# Patient Record
Sex: Female | Born: 1995 | Race: Black or African American | Hispanic: No | Marital: Single | Smoking: Never smoker
Health system: Southern US, Community
[De-identification: ages and names within clinical notes are randomized; demographics above are authoritative.]

## PROBLEM LIST (undated history)

## (undated) DIAGNOSIS — N750 Cyst of Bartholin's gland: Secondary | ICD-10-CM

---

## 2017-02-04 ENCOUNTER — Encounter (HOSPITAL_COMMUNITY): Payer: Self-pay

## 2017-02-04 ENCOUNTER — Emergency Department (HOSPITAL_COMMUNITY)
Admission: EM | Admit: 2017-02-04 | Discharge: 2017-02-04 | Disposition: A | Discharge status: Home / Self Care | Payer: Self-pay | Attending: Emergency Medicine | Admitting: Emergency Medicine

## 2017-02-04 DIAGNOSIS — Z5321 Procedure and treatment not carried out due to patient leaving prior to being seen by health care provider: Secondary | ICD-10-CM | POA: Insufficient documentation

## 2017-02-04 DIAGNOSIS — R109 Unspecified abdominal pain: Secondary | ICD-10-CM | POA: Insufficient documentation

## 2017-02-04 HISTORY — DX: Cyst of Bartholin's gland: N75.0

## 2017-02-04 LAB — URINALYSIS, ROUTINE W REFLEX MICROSCOPIC
BILIRUBIN URINE: NEGATIVE
Bacteria, UA: NONE SEEN
GLUCOSE, UA: NEGATIVE mg/dL
HGB URINE DIPSTICK: NEGATIVE
Ketones, ur: NEGATIVE mg/dL
NITRITE: NEGATIVE
PH: 7 (ref 5.0–8.0)
Protein, ur: NEGATIVE mg/dL
SPECIFIC GRAVITY, URINE: 1.024 (ref 1.005–1.030)

## 2017-02-04 LAB — COMPREHENSIVE METABOLIC PANEL
ALBUMIN: 4.1 g/dL (ref 3.5–5.0)
ALK PHOS: 47 U/L (ref 38–126)
ALT: 20 U/L (ref 14–54)
ANION GAP: 7 (ref 5–15)
AST: 23 U/L (ref 15–41)
BUN: 10 mg/dL (ref 6–20)
CALCIUM: 9.1 mg/dL (ref 8.9–10.3)
CO2: 24 mmol/L (ref 22–32)
Chloride: 107 mmol/L (ref 101–111)
Creatinine, Ser: 0.67 mg/dL (ref 0.44–1.00)
GFR calc Af Amer: >60 mL/min (ref >60)
GLUCOSE: 89 mg/dL (ref 65–99)
POTASSIUM: 3.4 mmol/L — AB (ref 3.5–5.1)
Sodium: 138 mmol/L (ref 135–145)
TOTAL PROTEIN: 7.4 g/dL (ref 6.5–8.1)
Total Bilirubin: 0.5 mg/dL (ref 0.3–1.2)

## 2017-02-04 LAB — CBC
HEMATOCRIT: 34.8 % — AB (ref 36.0–46.0)
Hemoglobin: 11.9 g/dL — ABNORMAL LOW (ref 12.0–15.0)
MCH: 29.6 pg (ref 26.0–34.0)
MCHC: 34.2 g/dL (ref 30.0–36.0)
MCV: 86.6 fL (ref 78.0–100.0)
Platelets: 226 10*3/uL (ref 150–400)
RBC: 4.02 MIL/uL (ref 3.87–5.11)
RDW: 14.6 % (ref 11.5–15.5)
WBC: 5.2 10*3/uL (ref 4.0–10.5)

## 2017-02-04 LAB — I-STAT BETA HCG BLOOD, ED (MC, WL, AP ONLY)

## 2017-02-04 LAB — LIPASE, BLOOD: Lipase: 26 U/L (ref 11–51)

## 2017-02-04 NOTE — ED Triage Notes (Signed)
Per EMS- Patient c/o abdominal pain since this AM..

## 2017-02-04 NOTE — ED Notes (Signed)
Called Pt in lobby no response x2. 

## 2017-02-04 NOTE — ED Notes (Signed)
Called Pt in lobby for vital recheck, no response. 

## 2017-02-04 NOTE — ED Notes (Signed)
Called Pt in lobby no response x3

## 2017-02-04 NOTE — ED Triage Notes (Signed)
Patient c/o lower abdominal pain since 0900. Patient denies any vaginal discharge or dysuria.

## 2017-03-16 ENCOUNTER — Encounter: Payer: Self-pay | Admitting: Emergency Medicine

## 2017-03-16 ENCOUNTER — Emergency Department (HOSPITAL_COMMUNITY)
Admission: EM | Admit: 2017-03-16 | Discharge: 2017-03-16 | Disposition: A | Discharge status: Home / Self Care | Payer: Self-pay | Attending: Emergency Medicine | Admitting: Emergency Medicine

## 2017-03-16 ENCOUNTER — Other Ambulatory Visit: Payer: Self-pay

## 2017-03-16 DIAGNOSIS — R1012 Left upper quadrant pain: Secondary | ICD-10-CM | POA: Insufficient documentation

## 2017-03-16 DIAGNOSIS — R1032 Left lower quadrant pain: Secondary | ICD-10-CM | POA: Insufficient documentation

## 2017-03-16 DIAGNOSIS — R109 Unspecified abdominal pain: Secondary | ICD-10-CM

## 2017-03-16 LAB — COMPREHENSIVE METABOLIC PANEL
ALT: 18 U/L (ref 14–54)
ANION GAP: 9 (ref 5–15)
AST: 21 U/L (ref 15–41)
Albumin: 4.1 g/dL (ref 3.5–5.0)
Alkaline Phosphatase: 52 U/L (ref 38–126)
BUN: 12 mg/dL (ref 6–20)
CALCIUM: 9.4 mg/dL (ref 8.9–10.3)
CHLORIDE: 107 mmol/L (ref 101–111)
CO2: 21 mmol/L — AB (ref 22–32)
Creatinine, Ser: 0.6 mg/dL (ref 0.44–1.00)
GFR calc non Af Amer: >60 mL/min (ref >60)
Glucose, Bld: 90 mg/dL (ref 65–99)
POTASSIUM: 3.7 mmol/L (ref 3.5–5.1)
SODIUM: 137 mmol/L (ref 135–145)
Total Bilirubin: 0.4 mg/dL (ref 0.3–1.2)
Total Protein: 7.8 g/dL (ref 6.5–8.1)

## 2017-03-16 LAB — POC URINE PREG, ED: PREG TEST UR: NEGATIVE

## 2017-03-16 LAB — CBC WITH DIFFERENTIAL/PLATELET
Basophils Absolute: 0 10*3/uL (ref 0.0–0.1)
Basophils Relative: 0 %
EOS ABS: 0 10*3/uL (ref 0.0–0.7)
Eosinophils Relative: 0 %
HCT: 35.4 % — ABNORMAL LOW (ref 36.0–46.0)
HEMOGLOBIN: 12.1 g/dL (ref 12.0–15.0)
LYMPHS ABS: 1.6 10*3/uL (ref 0.7–4.0)
LYMPHS PCT: 20 %
MCH: 29.6 pg (ref 26.0–34.0)
MCHC: 34.2 g/dL (ref 30.0–36.0)
MCV: 86.6 fL (ref 78.0–100.0)
Monocytes Absolute: 0.5 10*3/uL (ref 0.1–1.0)
Monocytes Relative: 7 %
NEUTROS PCT: 73 %
Neutro Abs: 6 10*3/uL (ref 1.7–7.7)
Platelets: 213 10*3/uL (ref 150–400)
RBC: 4.09 MIL/uL (ref 3.87–5.11)
RDW: 13.4 % (ref 11.5–15.5)
WBC: 8.1 10*3/uL (ref 4.0–10.5)

## 2017-03-16 LAB — URINALYSIS, ROUTINE W REFLEX MICROSCOPIC
Bilirubin Urine: NEGATIVE
Glucose, UA: NEGATIVE mg/dL
Hgb urine dipstick: NEGATIVE
Ketones, ur: NEGATIVE mg/dL
Nitrite: NEGATIVE
PROTEIN: 100 mg/dL — AB
Specific Gravity, Urine: 1.025 (ref 1.005–1.030)
pH: 5 (ref 5.0–8.0)

## 2017-03-16 LAB — I-STAT BETA HCG BLOOD, ED (MC, WL, AP ONLY)

## 2017-03-16 LAB — LIPASE, BLOOD: Lipase: 34 U/L (ref 11–51)

## 2017-03-16 NOTE — ED Triage Notes (Signed)
Patient states she was walking with a friend and "play fighting" at 1400 yesterday and states she may have strained something. Unknown if she is pregnant.

## 2017-03-16 NOTE — Discharge Instructions (Signed)
You can treat your symptoms with advil or tylenol as needed. Return to the ER if symptoms worsen, if you have blood in your stool, are unable to keep fluids down, or for new or concerning symptoms.

## 2017-03-16 NOTE — ED Triage Notes (Signed)
Patient states the pain has decreased since she has been sitting

## 2017-03-16 NOTE — ED Provider Notes (Signed)
[facility]-EMERGENCY DEPT Provider Note   CSN: [id number] Arrival date & time: 03/16/17  0346     History   Chief Complaint Chief Complaint  Patient presents with  . Abdominal Pain    HPI Candace Kim is a 21 y.o. female without significant PMHx, presenting to ED with gradual onset of intermittent left sided abdominal pain that began yesterday around 1400. Pt states she was "play fighting" with a friend and thinks she may have strained a muscle. She states pain comes and goes, and is not currently present in ED. She states last BM was just prior to arrival and normal. LMP Oct 9. Denies N/V/D/C, F/C, vaginal bleeding or discharge, urinary sx, or hx of abdominal surgeries.  The history is provided by the patient.    Past Medical History:  Diagnosis Date  . Bartholin cyst     There are no active problems to display for this patient.   History reviewed. No pertinent surgical history.  OB History    No data available       Home Medications    Prior to Admission medications   Medication Sig Start Date End Date Taking? Authorizing Provider  OVER THE COUNTER MEDICATION Take 1 tablet daily by mouth.   Yes [provider]    Family History Family History  Problem Relation Age of Onset  . Chronic Renal Failure Mother     Social History Social History   Tobacco Use  . Smoking status: Never Smoker  . Smokeless tobacco: Never Used  Substance Use Topics  . Alcohol use: No  . Drug use: No     Allergies   Patient has no known allergies.   Review of Systems Review of Systems  Constitutional: Negative for chills and fever.  Gastrointestinal: Positive for abdominal pain. Negative for constipation, diarrhea, nausea and vomiting.  Genitourinary: Negative for dysuria, frequency, vaginal bleeding and vaginal discharge.  All other systems reviewed and are negative.    Physical Exam Updated Vital Signs BP 126/69   Pulse 82   Temp  98.2 F (36.8 C) (Oral)   Resp 18   Ht 5' (1.524 m)   Wt 55.8 kg (123 lb)   LMP 02/15/2017 (Exact Date) Comment: patient states she is taking pills to get pregnant-fertile prep  SpO2 100%   BMI 24.02 kg/m   Physical Exam  Constitutional: She appears well-developed and well-nourished.  Non-toxic appearance. She does not appear ill. No distress.  HENT:  Head: Normocephalic and atraumatic.  Mouth/Throat: Oropharynx is clear and moist.  Eyes: Conjunctivae are normal.  Cardiovascular: Normal rate, regular rhythm and normal heart sounds.  Pulmonary/Chest: Effort normal and breath sounds normal.  Abdominal: Soft. Normal appearance and bowel sounds are normal. She exhibits no distension and no mass. There is tenderness in the left upper quadrant. There is no rigidity, no rebound, no guarding, no CVA tenderness and no tenderness at McBurney's point.  Neurological: She is alert.  Skin: Skin is warm.  Psychiatric: She has a normal mood and affect. Her behavior is normal.  Nursing note and vitals reviewed.    ED Treatments / Results  Labs (all labs ordered are listed, but only abnormal results are displayed) Labs Reviewed  URINALYSIS, ROUTINE W REFLEX MICROSCOPIC - Abnormal; Notable for the following components:      Result Value   APPearance HAZY (*)    Protein, ur 100 (*)    Leukocytes, UA SMALL (*)    Bacteria, UA FEW (*)  Squamous Epithelial / LPF 0-5 (*)    All other components within normal limits  COMPREHENSIVE METABOLIC PANEL - Abnormal; Notable for the following components:   CO2 21 (*)    All other components within normal limits  CBC WITH DIFFERENTIAL/PLATELET - Abnormal; Notable for the following components:   HCT 35.4 (*)    All other components within normal limits  LIPASE, BLOOD  I-STAT BETA HCG BLOOD, ED ([facility], [facility], [facility] ONLY)  POC URINE PREG, ED    EKG  EKG Interpretation None       Radiology No results found.  Procedures Procedures (including critical  care time)  Medications Ordered in ED Medications - No data to display   Initial Impression / Assessment and Plan / ED Course  I have reviewed the triage vital signs and the nursing notes.  Pertinent labs & imaging results that were available during my care of the patient were reviewed by me and considered in my medical decision making (see chart for details).    Pt w intermittent left-sided abdominal pain. Patient without pain in the ED. Patient is well-appearing, nontoxic, nonseptic appearing, in no apparent distress. Abdominal exam benign.     On exam patient does not have a surgical abdomen and there are no peritoneal signs.  No indication of appendicitis, bowel obstruction, bowel perforation, cholecystitis, diverticulitis,  or ectopic pregnancy. Do not feel imaging is indicated at this time. Labs wnl. U/A without infection. VSS. Patient does not meet the SIRS or Sepsis criteria. Patient discharged home with symptomatic treatment and given strict instructions for follow-up with their primary care physician.  Pt safe for discharge.  Discussed results, findings, treatment and follow up. Patient advised of return precautions. Patient verbalized understanding and agreed with plan.  Final Clinical Impressions(s) / ED Diagnoses   Final diagnoses:  Left sided abdominal pain    ED Discharge Orders    None       Robinson, Swazil[place]Jordan N, PA-C 03/16/17 [id number]    Derwood KaplanNanavati, Ankit, MD 03/17/17 [phone]

## 2017-04-24 ENCOUNTER — Emergency Department (HOSPITAL_COMMUNITY): Payer: Self-pay

## 2017-04-24 ENCOUNTER — Encounter (HOSPITAL_COMMUNITY): Payer: Self-pay | Admitting: Emergency Medicine

## 2017-04-24 ENCOUNTER — Emergency Department (HOSPITAL_COMMUNITY)
Admission: EM | Admit: 2017-04-24 | Discharge: 2017-04-24 | Disposition: A | Discharge status: Home / Self Care | Payer: Self-pay | Attending: Emergency Medicine | Admitting: Emergency Medicine

## 2017-04-24 DIAGNOSIS — W010XXA Fall on same level from slipping, tripping and stumbling without subsequent striking against object, initial encounter: Secondary | ICD-10-CM | POA: Insufficient documentation

## 2017-04-24 DIAGNOSIS — Y9302 Activity, running: Secondary | ICD-10-CM | POA: Insufficient documentation

## 2017-04-24 DIAGNOSIS — W19XXXA Unspecified fall, initial encounter: Secondary | ICD-10-CM

## 2017-04-24 DIAGNOSIS — S0990XA Unspecified injury of head, initial encounter: Secondary | ICD-10-CM | POA: Insufficient documentation

## 2017-04-24 DIAGNOSIS — T07XXXA Unspecified multiple injuries, initial encounter: Secondary | ICD-10-CM | POA: Insufficient documentation

## 2017-04-24 DIAGNOSIS — R42 Dizziness and giddiness: Secondary | ICD-10-CM | POA: Insufficient documentation

## 2017-04-24 DIAGNOSIS — Y92410 Unspecified street and highway as the place of occurrence of the external cause: Secondary | ICD-10-CM | POA: Insufficient documentation

## 2017-04-24 DIAGNOSIS — Z79899 Other long term (current) drug therapy: Secondary | ICD-10-CM | POA: Insufficient documentation

## 2017-04-24 DIAGNOSIS — R410 Disorientation, unspecified: Secondary | ICD-10-CM | POA: Insufficient documentation

## 2017-04-24 DIAGNOSIS — R51 Headache: Secondary | ICD-10-CM | POA: Insufficient documentation

## 2017-04-24 DIAGNOSIS — Y998 Other external cause status: Secondary | ICD-10-CM | POA: Insufficient documentation

## 2017-04-24 LAB — POC URINE PREG, ED: Preg Test, Ur: NEGATIVE

## 2017-04-24 MED ORDER — IBUPROFEN 200 MG PO TABS: 600.0000 mg | ORAL_TABLET | Freq: Once | ORAL | Status: DC

## 2017-04-24 MED ORDER — ONDANSETRON 8 MG PO TBDP: 8.0000 mg | ORAL_TABLET | Freq: Three times a day (TID) | ORAL | 0 refills | Status: AC | PRN

## 2017-04-24 MED ORDER — IBUPROFEN 600 MG PO TABS: 600.0000 mg | ORAL_TABLET | Freq: Four times a day (QID) | ORAL | 0 refills | Status: AC | PRN

## 2017-04-24 NOTE — ED Provider Notes (Signed)
[facility]-EMERGENCY DEPT Provider Note   CSN: [id number] Arrival date & time: 04/24/17  1313     History   Chief Complaint Chief Complaint  Patient presents with  . Fall  . Abrasion  . Head Injury    HPI Candace Kim is a 21 y.o. female.  HPI   Candace Kim is a 21 y.o. female, patient with no pertinent past medical history, presenting to the ED for evaluation following a fall that occurred shortly prior to arrival.  Patient states she was running across the street, slipped on some ice, and fell face first onto the concrete.  She is complaining of a moderate, right-sided, throbbing headache, as well as pain and abrasions to the right shoulder, right knee abrasions, and left hand abrasions.  She reports being disoriented and dizzy following the fall.  Patient's mother-in-law is at the bedside, was present at the time of injury, and corroborates the story.  Denies current dizziness, nausea/vomiting, neck/back pain, shortness of breath, chest pain, numbness/tingling, weakness, or any other complaints.   Past Medical History:  Diagnosis Date  . Bartholin cyst     There are no active problems to display for this patient.   History reviewed. No pertinent surgical history.  OB History    No data available       Home Medications    Prior to Admission medications   Medication Sig Start Date End Date Taking? Authorizing Provider  ibuprofen (ADVIL,MOTRIN) 600 MG tablet Take 1 tablet (600 mg total) by mouth every 6 (six) hours as needed. 04/24/17   Joy, Shawn C, PA-C  ondansetron (ZOFRAN ODT) 8 MG disintegrating tablet Take 1 tablet (8 mg total) by mouth every 8 (eight) hours as needed for nausea or vomiting. 04/24/17   Joy, Shawn C, PA-C  OVER THE COUNTER MEDICATION Take 1 tablet daily by mouth.    [provider]    Family History Family History  Problem Relation Age of Onset  . Chronic Renal Failure Mother     Social History Social  History   Tobacco Use  . Smoking status: Never Smoker  . Smokeless tobacco: Never Used  Substance Use Topics  . Alcohol use: No  . Drug use: No     Allergies   Patient has no known allergies.   Review of Systems Review of Systems  Respiratory: Negative for shortness of breath.   Cardiovascular: Negative for chest pain.  Gastrointestinal: Negative for abdominal pain, nausea and vomiting.  Musculoskeletal: Positive for myalgias. Negative for back pain and neck pain.  Skin: Positive for wound.  Neurological: Positive for dizziness (resolved) and headaches. Negative for syncope, weakness, light-headedness and numbness.  All other systems reviewed and are negative.    Physical Exam Updated Vital Signs BP 124/83 (BP Location: Right Arm)   Pulse 92   Temp 98.6 F (37 C) (Oral)   Resp 20   Ht 5' (1.524 m)   Wt 56.7 kg (125 lb)   LMP 04/13/2017   SpO2 100%   BMI 24.41 kg/m   Physical Exam  Constitutional: She is oriented to person, place, and time. She appears well-developed and well-nourished. No distress.  HENT:  Head: Normocephalic. Head is with abrasion.    Right Ear: Tympanic membrane, external ear and ear canal normal. No hemotympanum.  Left Ear: Tympanic membrane, external ear and ear canal normal. No hemotympanum.  Nose: Nose normal.  Abrasions, tenderness, and edema to the right frontal and right zygomatic regions.  No  noted deformity.  Eyes: Conjunctivae are normal.  Neck: Normal range of motion. Neck supple.  Cardiovascular: Normal rate, regular rhythm, normal heart sounds and intact distal pulses.  Pulmonary/Chest: Effort normal and breath sounds normal. No respiratory distress.  Abdominal: Soft. There is no tenderness. There is no guarding.  Musculoskeletal: She exhibits tenderness. She exhibits no edema.       Arms: Abduction of the right shoulder limited to 90 degrees.  Tenderness to the anterior right shoulder, extending into the right clavicle without  noted deformity or crepitus.  Multiple abrasions to the palmar and dorsal surfaces of the left hand.  Full range of motion in the left hand and fingers.  Full range of motion in the left shoulder, bilateral elbows, and bilateral wrists.  Small abrasion to the right anterior knee. Full range of motion in the bilateral hips, knees, and ankles.  Patella appears to be in correct anatomical position.  No noted swelling, deformity, crepitus, or laxity.  Normal motor function intact in patient's spine. No midline spinal tenderness.   Neurological: She is alert and oriented to person, place, and time.  No sensory deficits.  No noted speech deficits. No aphasia. Patient handles oral secretions without difficulty. No noted swallowing defects.  Equal grip strength bilaterally. Strength 5/5 in the upper extremities. Strength 5/5 with flexion and extension of the hips, knees, and ankles bilaterally.  Negative Romberg. No gait disturbance.  Coordination intact including heel to shin and finger to nose.  Cranial nerves III-XII grossly intact.  No facial droop.   Skin: Skin is warm and dry. Capillary refill takes less than 2 seconds. She is not diaphoretic.  Psychiatric: She has a normal mood and affect. Her behavior is normal.  Nursing note and vitals reviewed.              ED Treatments / Results  Labs (all labs ordered are listed, but only abnormal results are displayed) Labs Reviewed  POC URINE PREG, ED    EKG  EKG Interpretation None       Radiology Dg Clavicle Right  Result Date: 04/24/2017 CLINICAL DATA:  Fall onto right shoulder. EXAM: RIGHT CLAVICLE - 2+ VIEWS COMPARISON:  None. FINDINGS: There is no evidence of fracture or other focal bone lesions. Soft tissues are unremarkable. IMPRESSION: Negative. Electronically Signed   By: Charlett NoseKevin  Dover M.D.   On: 04/24/2017 14:45   Dg Shoulder Right  Result Date: 04/24/2017 CLINICAL DATA:  Fall onto right shoulder. EXAM:  RIGHT SHOULDER - 2+ VIEW COMPARISON:  None. FINDINGS: There is no evidence of fracture or dislocation. There is no evidence of arthropathy or other focal bone abnormality. Soft tissues are unremarkable. IMPRESSION: Negative. Electronically Signed   By: Charlett NoseKevin  Dover M.D.   On: 04/24/2017 14:45   Ct Head Wo Contrast  Result Date: 04/24/2017 CLINICAL DATA:  Fall, hit right face. EXAM: CT HEAD WITHOUT CONTRAST CT MAXILLOFACIAL WITHOUT CONTRAST CT CERVICAL SPINE WITHOUT CONTRAST TECHNIQUE: Multidetector CT imaging of the head, cervical spine, and maxillofacial structures were performed using the standard protocol without intravenous contrast. Multiplanar CT image reconstructions of the cervical spine and maxillofacial structures were also generated. COMPARISON:  None. FINDINGS: CT HEAD FINDINGS Brain: No acute intracranial abnormality. Specifically, no hemorrhage, hydrocephalus, mass lesion, acute infarction, or significant intracranial injury. Vascular: No hyperdense vessel or unexpected calcification. Skull: No acute calvarial abnormality. Other: None CT MAXILLOFACIAL FINDINGS Osseous: No fracture or mandibular dislocation. No destructive process. Orbits: Negative. No traumatic or inflammatory finding. Sinuses: Clear  Soft tissues: Soft tissue swelling over the right forehead laterally, right orbit and right upper cheek. 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 No fracture. Soft tissues and spinal canal: Prevertebral soft tissues are normal. No epidural or paraspinal hematoma. Disc levels:  Maintained Upper chest: Negative Other: None IMPRESSION: No intracranial abnormality. No acute bony abnormality in the face or cervical spine. Electronically Signed   By: Charlett NoseKevin  Dover M.D.   On: 04/24/2017 14:39   Ct Cervical Spine Wo Contrast  Result Date: 04/24/2017 CLINICAL DATA:  Fall, hit right face. EXAM: CT HEAD WITHOUT CONTRAST CT  MAXILLOFACIAL WITHOUT CONTRAST CT CERVICAL SPINE WITHOUT CONTRAST TECHNIQUE: Multidetector CT imaging of the head, cervical spine, and maxillofacial structures were performed using the standard protocol without intravenous contrast. Multiplanar CT image reconstructions of the cervical spine and maxillofacial structures were also generated. COMPARISON:  None. FINDINGS: CT HEAD FINDINGS Brain: No acute intracranial abnormality. Specifically, no hemorrhage, hydrocephalus, mass lesion, acute infarction, or significant intracranial injury. Vascular: No hyperdense vessel or unexpected calcification. Skull: No acute calvarial abnormality. Other: None CT MAXILLOFACIAL FINDINGS Osseous: No fracture or mandibular dislocation. No destructive process. Orbits: Negative. No traumatic or inflammatory finding. Sinuses: Clear Soft tissues: Soft tissue swelling over the right forehead laterally, right orbit and right upper cheek. 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 No fracture. Soft tissues and spinal canal: Prevertebral soft tissues are normal. No epidural or paraspinal hematoma. Disc levels:  Maintained Upper chest: Negative Other: None IMPRESSION: No intracranial abnormality. No acute bony abnormality in the face or cervical spine. Electronically Signed   By: Charlett NoseKevin  Dover M.D.   On: 04/24/2017 14:39   Dg Knee Complete 4 Views Right  Result Date: 04/24/2017 CLINICAL DATA:  Fall onto right knee for. EXAM: RIGHT KNEE - COMPLETE 4+ VIEW COMPARISON:  None. FINDINGS: No evidence of fracture, dislocation, or joint effusion. No evidence of arthropathy or other focal bone abnormality. Soft tissues are unremarkable. IMPRESSION: Negative. Electronically Signed   By: Charlett NoseKevin  Dover M.D.   On: 04/24/2017 14:46   Dg Hand Complete Left  Result Date: 04/24/2017 CLINICAL DATA:  Fall, multiple abrasions. EXAM: LEFT HAND - COMPLETE 3+ VIEW COMPARISON:  None.  FINDINGS: There is no evidence of fracture or dislocation. There is no evidence of arthropathy or other focal bone abnormality. Soft tissues are unremarkable. IMPRESSION: Negative. Electronically Signed   By: Charlett NoseKevin  Dover M.D.   On: 04/24/2017 14:46   Ct Maxillofacial Wo Contrast  Result Date: 04/24/2017 CLINICAL DATA:  Fall, hit right face. EXAM: CT HEAD WITHOUT CONTRAST CT MAXILLOFACIAL WITHOUT CONTRAST CT CERVICAL SPINE WITHOUT CONTRAST TECHNIQUE: Multidetector CT imaging of the head, cervical spine, and maxillofacial structures were performed using the standard protocol without intravenous contrast. Multiplanar CT image reconstructions of the cervical spine and maxillofacial structures were also generated. COMPARISON:  None. FINDINGS: CT HEAD FINDINGS Brain: No acute intracranial abnormality. Specifically, no hemorrhage, hydrocephalus, mass lesion, acute infarction, or significant intracranial injury. Vascular: No hyperdense vessel or unexpected calcification. Skull: No acute calvarial abnormality. Other: None CT MAXILLOFACIAL FINDINGS Osseous: No fracture or mandibular dislocation. No destructive process. Orbits: Negative. No traumatic or inflammatory finding. Sinuses: Clear Soft tissues: Soft tissue swelling over the right forehead laterally, right orbit and right upper cheek. 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 No fracture. Soft  tissues and spinal canal: Prevertebral soft tissues are normal. No epidural or paraspinal hematoma. Disc levels:  Maintained Upper chest: Negative Other: None IMPRESSION: No intracranial abnormality. No acute bony abnormality in the face or cervical spine. Electronically Signed   By: Charlett Nose M.D.   On: 04/24/2017 14:39    Procedures Procedures (including critical care time)  Medications Ordered in ED Medications - No data to display   Initial Impression / Assessment and Plan / ED  Course  I have reviewed the triage vital signs and the nursing notes.  Pertinent labs & imaging results that were available during my care of the patient were reviewed by me and considered in my medical decision making (see chart for details).  Clinical Course as of Apr 24 1524  Sun Apr 24, 2017  1355 Patient declined pain management at this time.  [SJ]  1500 Discussed imaging results with the patient and her husband at the bedside.  Patient continues to decline pain management.  Able to complete the exam with range of motion demonstrated in the extremities.  Discussed head injury and concussion treatment and precautions in detail.  Answered all questions.  [SJ]    Clinical Course User Index [SJ] Joy, Shawn C, PA-C    Patient presents for evaluation following a fall.  CT studies obtained due to mechanism and physical exam findings. No acute abnormalities on imaging studies.  No change in patient presentation or symptoms during ED course.  PCP versus concussion clinic follow-up.  Resources given. The patient was given instructions for home care as well as return precautions. Patient voices understanding of these instructions, accepts the plan, and is comfortable with discharge.   Vitals:   04/24/17 1321 04/24/17 1331  BP: 124/83   Pulse: 92   Resp: 20   Temp: 98.6 F (37 C)   TempSrc: Oral   SpO2: 100%   Weight:  56.7 kg (125 lb)  Height:  5' (1.524 m)     Final Clinical Impressions(s) / ED Diagnoses   Final diagnoses:  Fall, initial encounter  Injury of head, initial encounter  Abrasions of multiple sites    ED Discharge Orders        Ordered    ibuprofen (ADVIL,MOTRIN) 600 MG tablet  Every 6 hours PRN     04/24/17 1517    ondansetron (ZOFRAN ODT) 8 MG disintegrating tablet  Every 8 hours PRN     04/24/17 1517       Anselm Pancoast, PA-C 04/24/17 1525    Alvira Monday, MD 04/25/17 1844

## 2017-04-24 NOTE — ED Triage Notes (Signed)
Pt comes in after running across the street trying to avoid vehicles and slipped and fell.  Small area of road rash noted to right shoulder as well as to right cheek bone and right orbital.  Soft tissue swelling noted. No LOC or blood thinner use.  Complaints of headache.

## 2017-04-24 NOTE — Discharge Instructions (Addendum)
There were no acute abnormalities noted on x-ray or CT scan.   Head Injury You have been seen today for a head injury. It does not appear to be serious at this time.  Close observation: The close observation period is usually 6 hours from the injury. This includes staying awake and having a trustworthy adult monitor you to assure your condition does not worsen. You should be in regular contact with this person and ideally, they should be able to monitor you in person.  Secondary observation: The secondary observation period is usually 24 hours from the injury. You are allowed to sleep during this time. A trustworthy adult should intermittently monitor you to assure your condition does not worsen.   Overall head injury/concussion care: Rest: Be sure to get plenty of rest. You will need more rest and sleep while you recover. Hydration: Be sure to stay well hydrated by having a goal of drinking about 0.5 liters of water an hour. Pain:  Antiinflammatory medications: Take 600 mg of ibuprofen every 6 hours or 440 mg (over the counter dose) to 500 mg (prescription dose) of naproxen every 12 hours or for the next 3 days. After this time, these medications may be used as needed for pain. Take these medications with food to avoid upset stomach. Choose only one of these medications, do not take them together. Tylenol: Should you continue to have additional pain while taking the ibuprofen or naproxen, you may add in tylenol as needed. Your daily total maximum amount of tylenol from all sources should be limited to 4000mg /day for persons without liver problems, or 2000mg /day for those with liver problems. Nausea: May use Zofran as needed for nausea. Return to sports and activities: In general, you may return to normal activities once symptoms have subsided, however, you would ideally be cleared by a primary care provider or other qualified medical professional prior to return to these activities.  Follow up:  Follow up with the concussion clinic or your primary care provider for further management of this issue. Return: Return to the ED should you begin to have confusion, abnormal behavior, aggression, violence, or personality changes, repeated vomiting, vision loss, numbness or weakness on one side of the body, difficulty standing due to dizziness, significantly worsening pain, or any other major concerns.  You have been seen today for shoulder and knee injuries. There were no acute abnormalities on the x-rays, including no sign of fracture or dislocation, however, there could be injuries to the soft tissues, such as the ligaments or tendons that are not seen on xrays. There could also be what are called occult fractures that are small fractures not seen on xray. Pain: Take 600 mg of ibuprofen every 6 hours or 440 mg (over the counter dose) to 500 mg (prescription dose) of naproxen every 12 hours for the next 3 days. After this time, these medications may be used as needed for pain. Take these medications with food to avoid upset stomach. Choose only one of these medications, do not take them together.  Tylenol: Should you continue to have additional pain while taking the ibuprofen or naproxen, you may add in tylenol as needed. Your daily total maximum amount of tylenol from all sources should be limited to 4000mg /day for persons without liver problems, or 2000mg /day for those with liver problems. Ice: May apply ice to the area over the next 24 hours for 15 minutes at a time to reduce swelling. Elevation: Keep the extremity elevated as often as possible to  reduce pain and inflammation. Exercises: Start by performing these exercises a few times a week, increasing the frequency until you are performing them twice daily.  Follow up: If symptoms are improving, you may follow up with your primary care provider for any continued management. If symptoms are not improving, you may follow up with the orthopedic  specialist.

## 2019-03-28 IMAGING — CT CT MAXILLOFACIAL W/O CM
5 of 10 series · 17 of 47 positions shown, 19 images · non-contrast
Comparison: None.

CLINICAL DATA: Fall, hit right face.

EXAM:
CT HEAD WITHOUT CONTRAST
CT MAXILLOFACIAL WITHOUT CONTRAST
CT CERVICAL SPINE WITHOUT CONTRAST
TECHNIQUE: Multidetector CT imaging of the head, cervical spine, and
maxillofacial structures were performed using the standard protocol
without intravenous contrast. Multiplanar CT image reconstructions
of the cervical spine and maxillofacial structures were also
generated.

[Series 3: head wo · axial · 0.37mm/px · z∈[-95,-50]mm · 2 of 29 slices shown]
[im 10/29  bone]
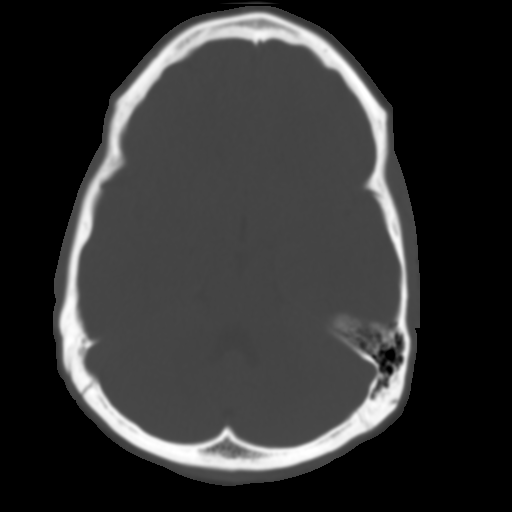
[im 19/29  bone]
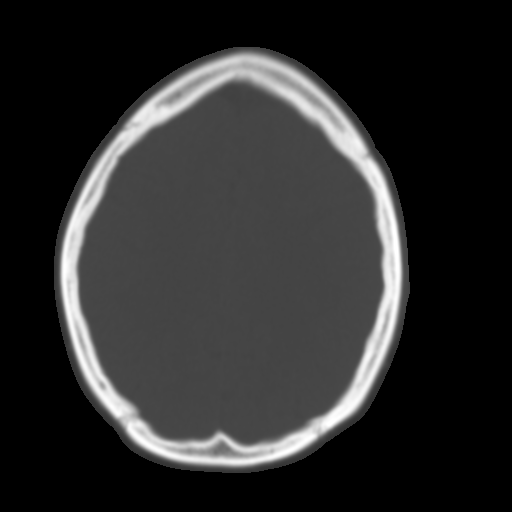

[Series 7: max soft · axial · 0.33mm/px · z∈[-221,-161]mm · 4 of 72 slices shown]
[im 11/72  brain]
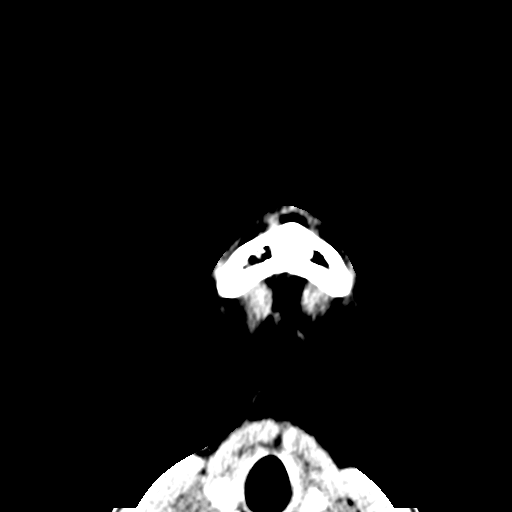
[im 21/72  brain]
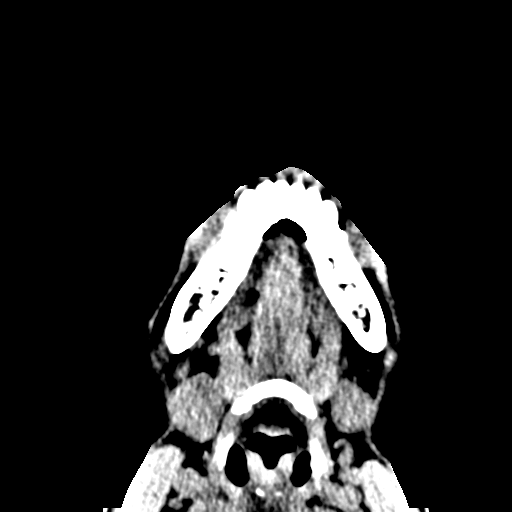
[im 31/72  brain]
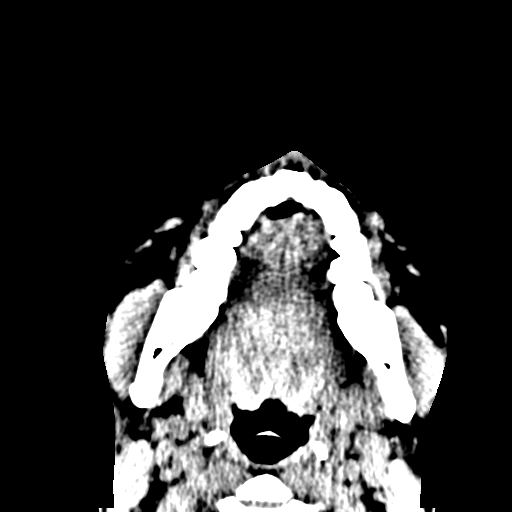
[im 41/72  brain]
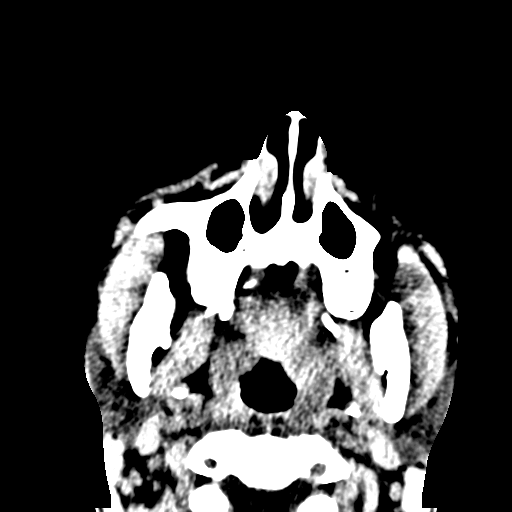

[Series 9: coronal soft · coronal · 0.29mm/px · 2 of 57 slices shown]
[im 19/57  bone]
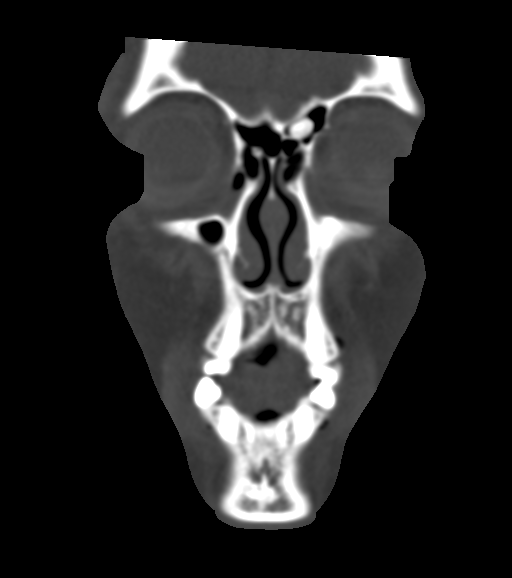
[im 38/57  bone]
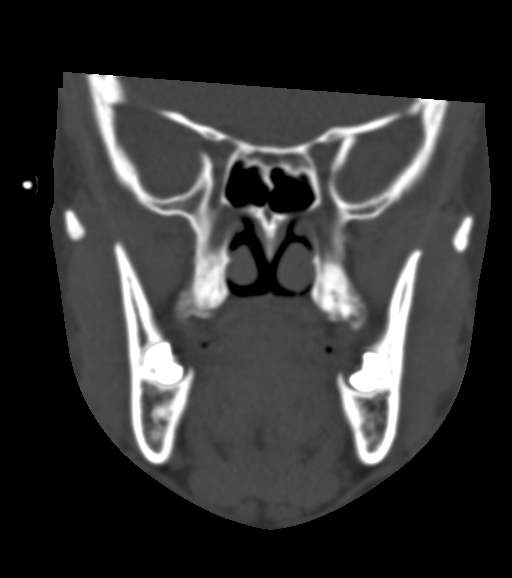

[Series 10: sagittal soft · sagittal · 0.31mm/px · 1 of 72 slices shown]
[im 36/72  bone]
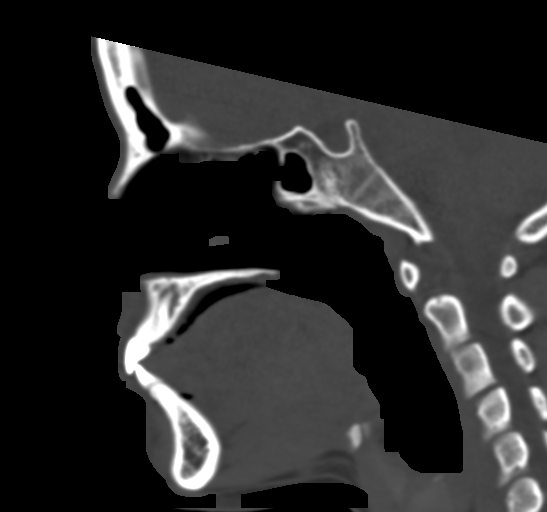

[Series 15: orthogonal axials · axial · 0.23mm/px · z∈[-277,-155]mm · 8 of 91 slices shown, 10 images]
[im 11/91  brain]
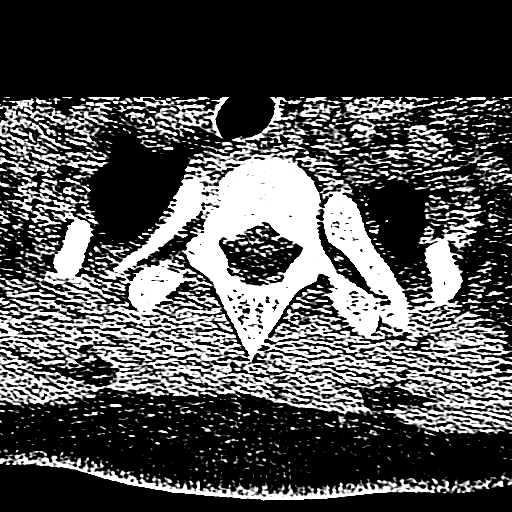
[im 11/91  bone]
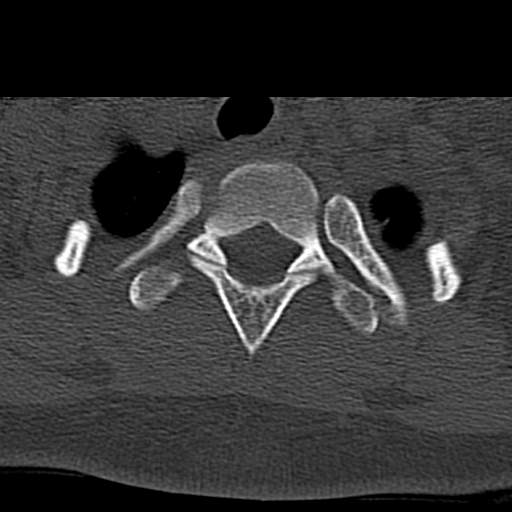
[im 21/91  bone]
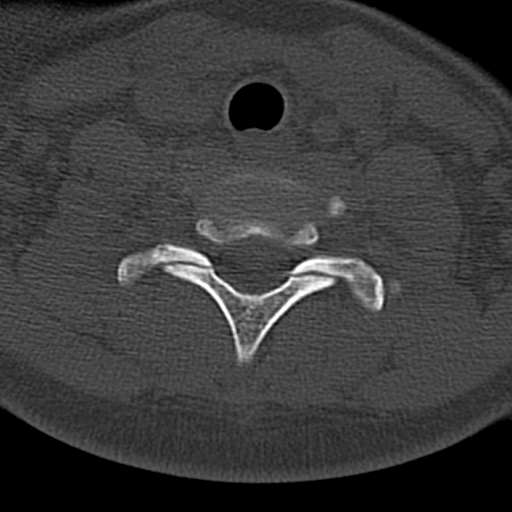
[im 31/91  bone]
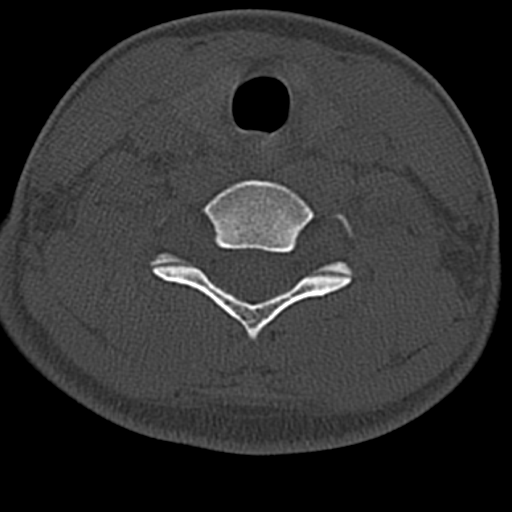
[im 41/91  bone]
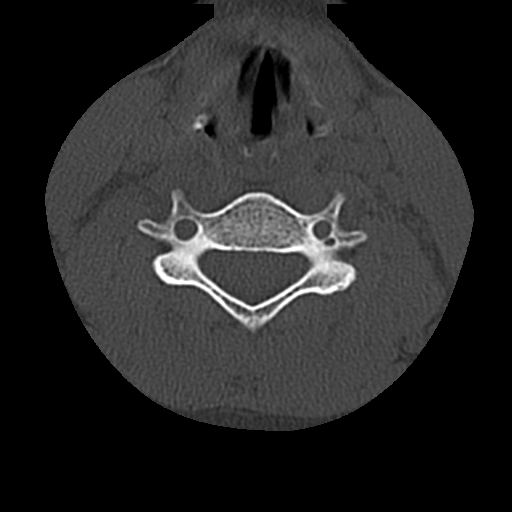
[im 51/91  brain]
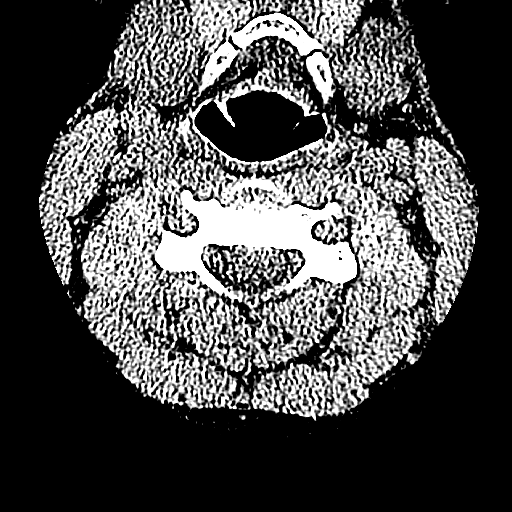
[im 51/91  bone]
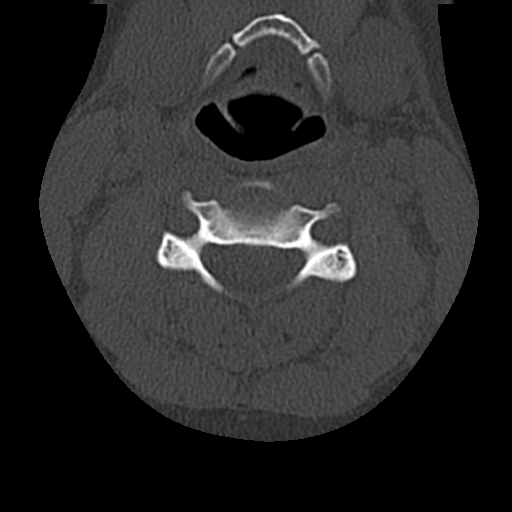
[im 61/91  bone]
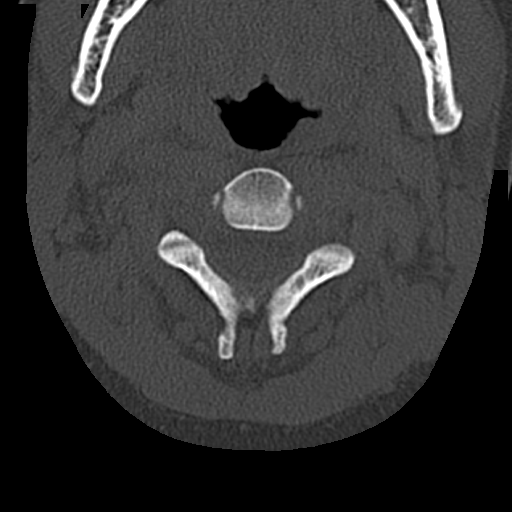
[im 71/91  bone]
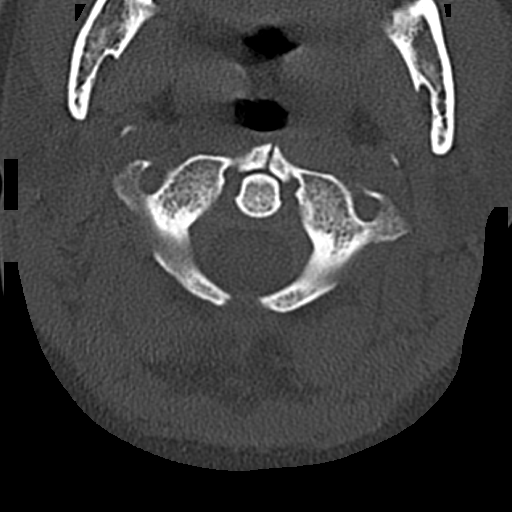
[im 81/91  bone]
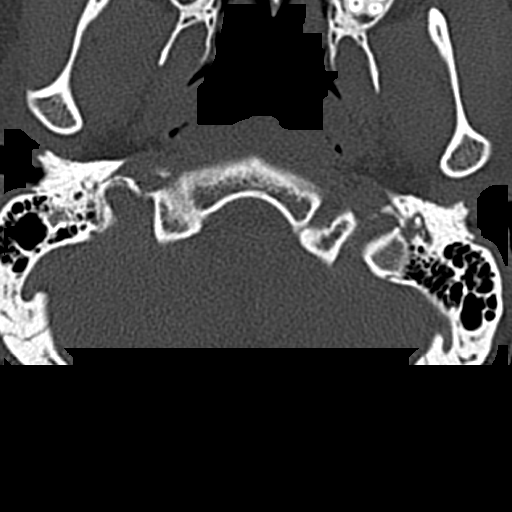

[17 of 47 positions shown; findings below may reference images not displayed]

FINDINGS: CT HEAD FINDINGS

Brain: No acute intracranial abnormality. Specifically, no
hemorrhage, hydrocephalus, mass lesion, acute infarction, or
significant intracranial injury.

Vascular: No hyperdense vessel or unexpected calcification.

Skull: No acute calvarial abnormality.

Other: None

CT MAXILLOFACIAL FINDINGS

Osseous: No fracture or mandibular dislocation. No destructive
process.

Orbits: Negative. No traumatic or inflammatory finding.

Sinuses: Clear

Soft tissues: Soft tissue swelling over the right forehead
laterally, right orbit and right upper cheek.

CT CERVICAL SPINE FINDINGS

Alignment: Normal

Skull base and vertebrae: Congenital nonunion of the anterior arch
of C1. No fracture.

Soft tissues and spinal canal: Prevertebral soft tissues are normal.
No epidural or paraspinal hematoma.

Disc levels:  Maintained

Upper chest: Negative

Other: None
IMPRESSION: No intracranial abnormality.

No acute bony abnormality in the face or cervical spine.
